# Patient Record
Sex: Male | Born: 1965 | Race: White | Hispanic: No | Marital: Single | Smoking: Never smoker
Health system: Southern US, Community
[De-identification: ages and names within clinical notes are randomized; demographics above are authoritative.]

## PROBLEM LIST (undated history)

## (undated) DIAGNOSIS — F429 Obsessive-compulsive disorder, unspecified: Secondary | ICD-10-CM

## (undated) DIAGNOSIS — F329 Major depressive disorder, single episode, unspecified: Secondary | ICD-10-CM

## (undated) DIAGNOSIS — F32A Depression, unspecified: Secondary | ICD-10-CM

## (undated) DIAGNOSIS — F419 Anxiety disorder, unspecified: Secondary | ICD-10-CM

## (undated) HISTORY — DX: Major depressive disorder, single episode, unspecified: F32.9

## (undated) HISTORY — DX: Obsessive-compulsive disorder, unspecified: F42.9

## (undated) HISTORY — PX: HERNIA REPAIR: SHX51

## (undated) HISTORY — DX: Anxiety disorder, unspecified: F41.9

## (undated) HISTORY — DX: Depression, unspecified: F32.A

---

## 2010-04-21 ENCOUNTER — Emergency Department (INDEPENDENT_AMBULATORY_CARE_PROVIDER_SITE_OTHER): Payer: Worker's Compensation

## 2010-04-21 ENCOUNTER — Emergency Department (HOSPITAL_BASED_OUTPATIENT_CLINIC_OR_DEPARTMENT_OTHER)
Admission: EM | Admit: 2010-04-21 | Discharge: 2010-04-21 | Disposition: A | Discharge status: Home / Self Care | Payer: Worker's Compensation | Attending: Emergency Medicine | Admitting: Emergency Medicine

## 2010-04-21 DIAGNOSIS — M25579 Pain in unspecified ankle and joints of unspecified foot: Secondary | ICD-10-CM

## 2010-04-21 DIAGNOSIS — W19XXXA Unspecified fall, initial encounter: Secondary | ICD-10-CM | POA: Insufficient documentation

## 2010-04-21 DIAGNOSIS — Y9269 Other specified industrial and construction area as the place of occurrence of the external cause: Secondary | ICD-10-CM

## 2010-04-21 DIAGNOSIS — IMO0002 Reserved for concepts with insufficient information to code with codable children: Secondary | ICD-10-CM | POA: Insufficient documentation

## 2010-04-21 DIAGNOSIS — Y9289 Other specified places as the place of occurrence of the external cause: Secondary | ICD-10-CM | POA: Insufficient documentation

## 2010-04-21 DIAGNOSIS — Y999 Unspecified external cause status: Secondary | ICD-10-CM

## 2010-04-21 DIAGNOSIS — S93409A Sprain of unspecified ligament of unspecified ankle, initial encounter: Secondary | ICD-10-CM | POA: Insufficient documentation

## 2011-07-31 IMAGING — CR DG ANKLE COMPLETE 3+V*L*
3 series · 3 of 3 positions shown · non-contrast
Comparison: None.

CLINICAL DATA: Fall, pain.

LEFT ANKLE COMPLETE - 3+ VIEW

[t ankle joint ap left]
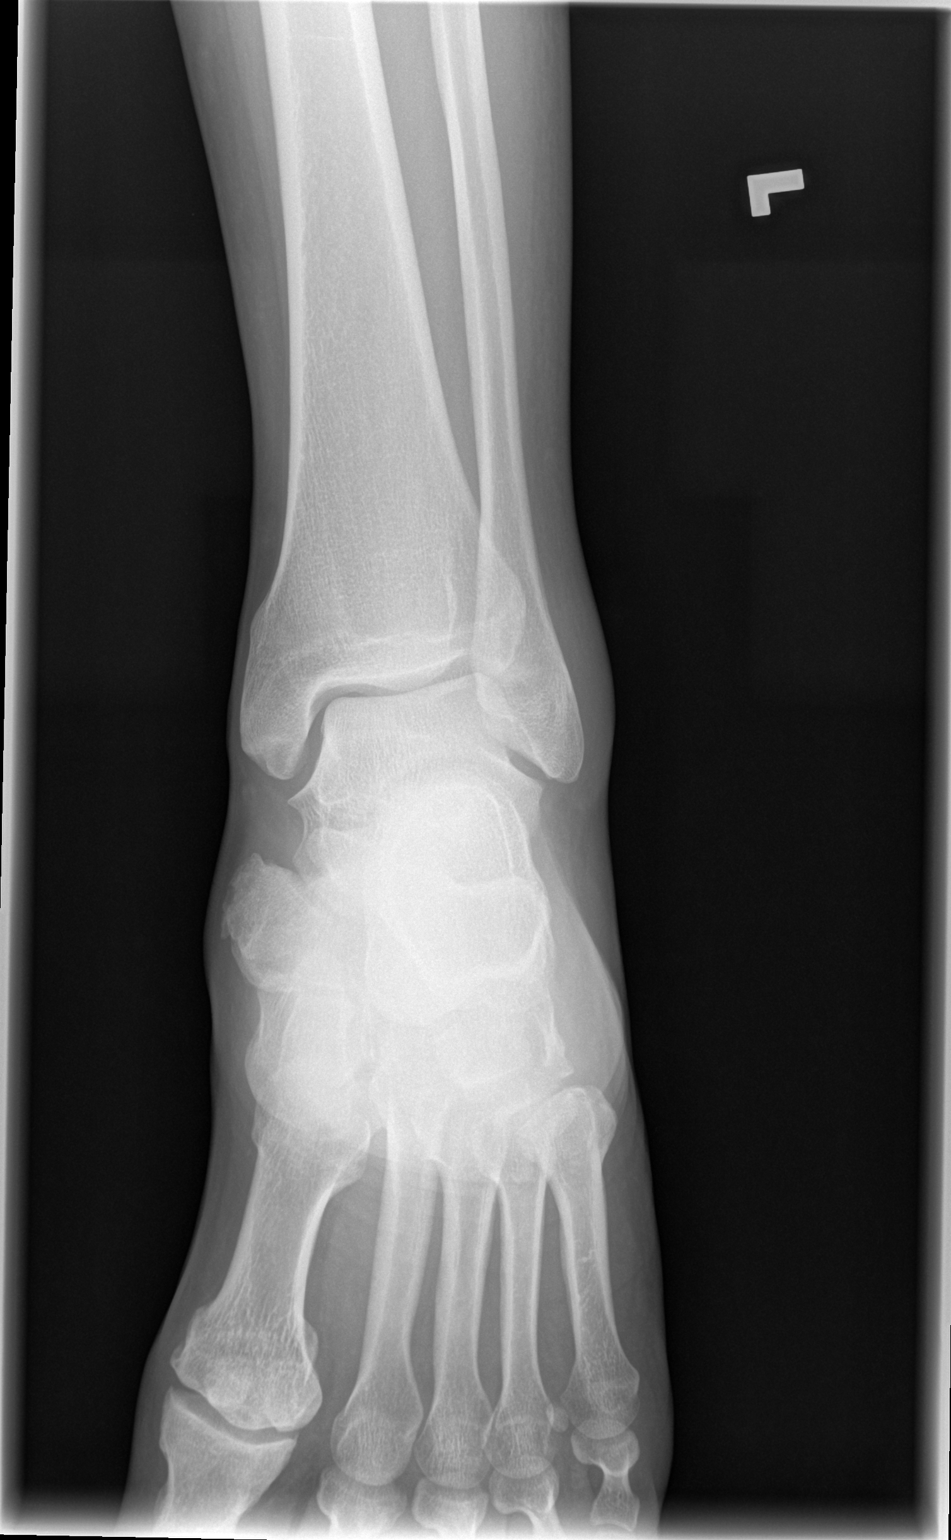

[t ankle joint oblique left]
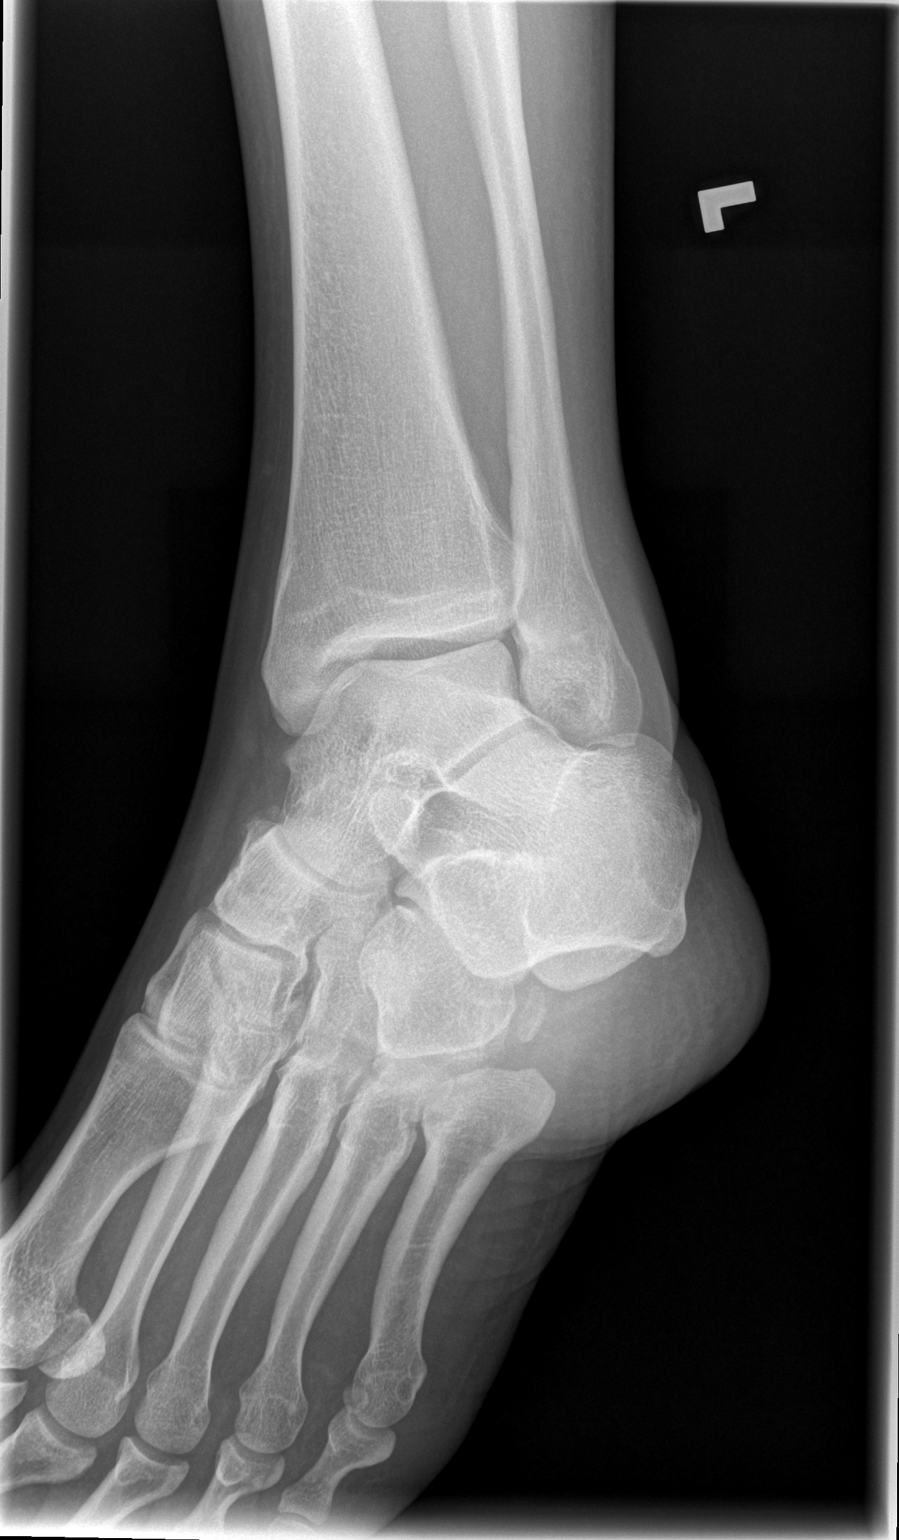

[t ankle joint lat left]
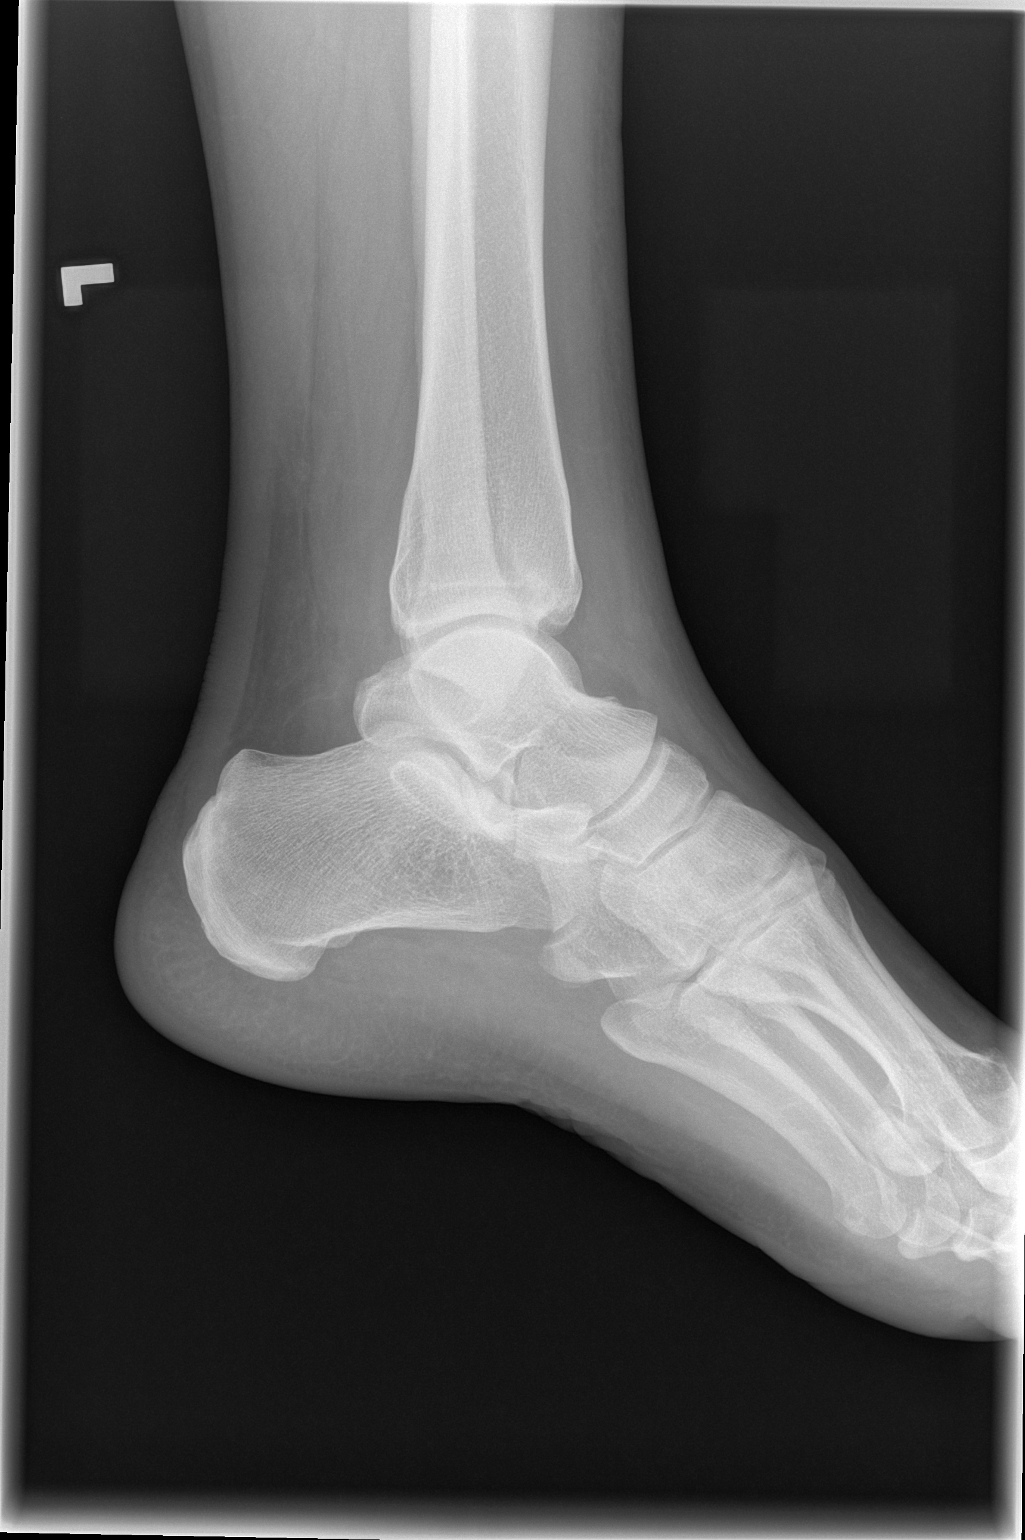

[3 of 3 positions shown; findings below may reference images not displayed]

FINDINGS: There is some soft tissue swelling about the distal
fibula.  No underlying fracture dislocation.
IMPRESSION: Lateral soft tissue swelling without underlying acute bony or joint
abnormality.

## 2011-08-02 ENCOUNTER — Ambulatory Visit (INDEPENDENT_AMBULATORY_CARE_PROVIDER_SITE_OTHER): Payer: No Typology Code available for payment source | Admitting: Family Medicine

## 2011-08-02 VITALS — BP 113/74 | HR 71 | Temp 98.1°F | Resp 16 | Ht 73.0 in | Wt 209.8 lb

## 2011-08-02 DIAGNOSIS — F429 Obsessive-compulsive disorder, unspecified: Secondary | ICD-10-CM

## 2011-08-02 MED ORDER — FLUOXETINE HCL 40 MG PO CAPS: 80.0000 mg | ORAL_CAPSULE | Freq: Every day | ORAL | Status: DC

## 2011-08-02 NOTE — Progress Notes (Signed)
  Subjective:    Patient ID: Jose Spears, male    DOB: [birth year], 46 y.o.   MRN: [id number]  HPI 46 yo male last seen here June 2011.  On prozac.  Needs refill.  Was refilled last at [facility] (closed now).  Doing well on 80 mg.  Takes for OCD.   No other problems or concerns today.    Review of Systems Negative except as per HPI     Objective:   Physical Exam  Constitutional: He appears well-developed and well-nourished.  Cardiovascular: Normal rate, regular rhythm, normal heart sounds and intact distal pulses.   No murmur heard. Pulmonary/Chest: Effort normal and breath sounds normal.  Neurological: He is alert.  Skin: Skin is warm and dry.          Assessment & Plan:  OCD - refilled prozac 40, 2 po daily #60 refill x 5

## 2012-03-22 ENCOUNTER — Ambulatory Visit (INDEPENDENT_AMBULATORY_CARE_PROVIDER_SITE_OTHER): Payer: No Typology Code available for payment source | Admitting: Emergency Medicine

## 2012-03-22 VITALS — BP 124/80 | HR 77 | Temp 98.9°F | Resp 18 | Wt 215.0 lb

## 2012-03-22 DIAGNOSIS — F429 Obsessive-compulsive disorder, unspecified: Secondary | ICD-10-CM

## 2012-03-22 MED ORDER — FLUOXETINE HCL 40 MG PO CAPS: 80.0000 mg | ORAL_CAPSULE | Freq: Every day | ORAL | Status: DC

## 2012-03-22 NOTE — Progress Notes (Signed)
[facility] [address], [place] [address] [phone]  Date:  03/22/2012   Name:  Jose Spears   DOB:  Apr 15, 1965   MRN:  [id number]  PCP:  No primary provider on file.    Chief Complaint: OCD   History of Present Illness:  Jose Spears is a 47 y.o. very pleasant male patient who presents with the following:  History of OCD on medication 8 years and is well controlled.  Sleeping well.  No side effects of drug.  Previous depression is controlled.  There is no problem list on file for this patient.   Past Medical History  Diagnosis Date  . Depression   . Anxiety   . OCD (obsessive compulsive disorder)     Past Surgical History  Procedure Date  . Hernia repair     History  Substance Use Topics  . Smoking status: Never Smoker   . Smokeless tobacco: Not on file  . Alcohol Use: No    No family history on file.  No Known Allergies  Medication list has been reviewed and updated.  Current Outpatient Prescriptions on File Prior to Visit  Medication Sig Dispense Refill  . FLUoxetine (PROZAC) 40 MG capsule Take 2 capsules (80 mg total) by mouth daily.  60 capsule  5    Review of Systems:  As per HPI, otherwise negative.    Physical Examination: Filed Vitals:   03/22/12 1020  BP: 124/80  Pulse: 77  Temp: 98.9 F (37.2 C)  Resp: 18   Filed Vitals:   03/22/12 1020  Weight: 215 lb (97.523 kg)   There is no height on file to calculate BMI. Ideal Body Weight:    GEN: WDWN, NAD, Non-toxic, A & O x 3 HEENT: Atraumatic, Normocephalic. Neck supple. No masses, No LAD. Ears and Nose: No external deformity. CV: RRR, No M/G/R. No JVD. No thrill. No extra heart sounds. PULM: CTA B, no wheezes, crackles, rhonchi. No retractions. No resp. distress. No accessory muscle use. ABD: S, NT, ND, +BS. No rebound. No HSM. EXTR: No c/c/e NEURO Normal gait.  PSYCH: Normally interactive. Conversant. Not depressed or anxious appearing.   Calm demeanor.    Assessment and Plan: OCD Refill medication Follow up 6 months   Carmelina Dane, MD

## 2012-03-24 NOTE — Progress Notes (Signed)
Reviewed and agree.

## 2012-11-13 ENCOUNTER — Other Ambulatory Visit: Payer: Self-pay | Admitting: Emergency Medicine

## 2012-11-15 ENCOUNTER — Other Ambulatory Visit: Payer: Self-pay

## 2012-11-20 ENCOUNTER — Encounter: Payer: Self-pay | Admitting: Physician Assistant

## 2012-11-20 ENCOUNTER — Ambulatory Visit (INDEPENDENT_AMBULATORY_CARE_PROVIDER_SITE_OTHER): Payer: No Typology Code available for payment source | Admitting: Physician Assistant

## 2012-11-20 VITALS — BP 124/76 | HR 77 | Temp 98.0°F | Resp 16 | Ht 73.0 in | Wt 226.0 lb

## 2012-11-20 DIAGNOSIS — F429 Obsessive-compulsive disorder, unspecified: Secondary | ICD-10-CM

## 2012-11-20 MED ORDER — FLUOXETINE HCL 40 MG PO CAPS: 80.0000 mg | ORAL_CAPSULE | Freq: Every day | ORAL | Status: DC

## 2012-11-20 NOTE — Progress Notes (Signed)
  Subjective:    Patient ID: Jose Spears, male    DOB: 05/31/1965, 47 y.o.   MRN: [id number]  HPI 47 year old male presents for refills of Prozac 80 mg daily that he takes for OCD. Doing well - has been on Prozac for 8 years now. Stable on current dose. No adverse effects.  Denies depression/anxiety, insomnia.   Works as a Electrical engineer x about 8 years. Has lived in [place] his whole life.  Single.  Patient is doing well with no other concerns today.      Review of Systems  Constitutional: Negative for unexpected weight change.  Respiratory: Negative for chest tightness.   Gastrointestinal: Negative for nausea and vomiting.  Neurological: Negative for dizziness and headaches.  Psychiatric/Behavioral: Negative for sleep disturbance.       Objective:   Physical Exam  Constitutional: He is oriented to person, place, and time. He appears well-developed and well-nourished.  HENT:  Head: Normocephalic and atraumatic.  Right Ear: External ear normal.  Left Ear: External ear normal.  Eyes: Conjunctivae are normal.  Neck: Normal range of motion.  Cardiovascular: Normal rate, regular rhythm and normal heart sounds.   Pulmonary/Chest: Effort normal and breath sounds normal.  Neurological: He is alert and oriented to person, place, and time.  Psychiatric: He has a normal mood and affect. His behavior is normal. Judgment and thought content normal.          Assessment & Plan:  OCD (obsessive compulsive disorder) - Plan: FLUoxetine (PROZAC) 40 MG capsule  Refilled Prozac 80 mg daily #180 x 1 refill.  Recommend he schedule an appointment for a CPE in the next 6 months. Patient in agreement.

## 2012-12-20 NOTE — Telephone Encounter (Signed)
Pt was seen 11/20/12.

## 2012-12-27 ENCOUNTER — Ambulatory Visit (INDEPENDENT_AMBULATORY_CARE_PROVIDER_SITE_OTHER): Payer: No Typology Code available for payment source | Admitting: Family Medicine

## 2012-12-27 VITALS — BP 112/72 | HR 88 | Temp 98.3°F | Resp 20 | Ht 72.25 in | Wt 222.8 lb

## 2012-12-27 DIAGNOSIS — F429 Obsessive-compulsive disorder, unspecified: Secondary | ICD-10-CM

## 2012-12-27 DIAGNOSIS — F411 Generalized anxiety disorder: Secondary | ICD-10-CM

## 2012-12-27 MED ORDER — FLUOXETINE HCL 40 MG PO CAPS: 80.0000 mg | ORAL_CAPSULE | Freq: Every day | ORAL | Status: DC

## 2012-12-29 NOTE — Progress Notes (Signed)
Pt was seen last mo for a refill on his prozac by [facility] who gave him a 90d supply with 3 refills. However, either the rx didn't go through or the pharmacy didn't fill his new rx and just refilled his prior rx of a month worth of pills from Dr. Dareen Piano.  The bottle said he needed an OV before any further refills so he came back in today - he wasn't sure why we needed to see him again but he just came back.  I think this was an error - pt should have a years worth of refills so I sent the rx to his pharmacy again and waived the OV.

## 2013-04-03 ENCOUNTER — Telehealth: Payer: Self-pay

## 2013-04-03 NOTE — Telephone Encounter (Signed)
Called pt, lady answered the phone and I advised we would call him back. Pt unavailable.

## 2013-04-03 NOTE — Telephone Encounter (Signed)
Placed form in Dr. Alver FisherShaw's box

## 2013-04-03 NOTE — Telephone Encounter (Signed)
Patient took a drug screen for his job. States that it came back positive however he is currently taking Prozac which he states he got from our office. Patient last saw Dr. Clelia CroftShaw 12/27/2012. Patient dropped off a form showing his drug screen results and states that his employer needs someone to sign off on it indicating that he is in fact on Prozac. Patient will come back to pick up form.  [phone]

## 2013-04-03 NOTE — Telephone Encounter (Signed)
Prozac does not cause a + drug screen.  Looks like the picture showed he was + for benzodiazepines which he is not prescribed here.  Prozac is not a benzodiazepine - it is a selective serotonin inhibitor.

## 2013-04-05 ENCOUNTER — Telehealth: Payer: Self-pay

## 2013-04-05 NOTE — Telephone Encounter (Signed)
Patient needs a note saying that he is taking prozac  [phone]

## 2013-04-05 NOTE — Telephone Encounter (Signed)
Patient left the wrong contact number in previous message. Correct one is: [phone]

## 2013-04-05 NOTE — Telephone Encounter (Signed)
Spoke to pt. Advised Xanax would cause a positive drug screen but not Prozac. Pt slurring words while on the phone. Difficult to understand he questions and statements during our conversation.

## 2013-04-08 NOTE — Telephone Encounter (Signed)
Letter has been printed. Both numbers listed are incorrect. Called home number listed in chart advised pt letter in in the p/u folder.

## 2013-04-13 ENCOUNTER — Ambulatory Visit (INDEPENDENT_AMBULATORY_CARE_PROVIDER_SITE_OTHER): Payer: No Typology Code available for payment source | Admitting: Family Medicine

## 2013-04-13 VITALS — BP 110/72 | HR 96 | Temp 98.0°F | Resp 18 | Ht 72.0 in | Wt 217.4 lb

## 2013-04-13 DIAGNOSIS — R6882 Decreased libido: Secondary | ICD-10-CM

## 2013-04-13 DIAGNOSIS — F411 Generalized anxiety disorder: Secondary | ICD-10-CM

## 2013-04-13 DIAGNOSIS — F429 Obsessive-compulsive disorder, unspecified: Secondary | ICD-10-CM

## 2013-04-13 DIAGNOSIS — N529 Male erectile dysfunction, unspecified: Secondary | ICD-10-CM

## 2013-04-13 MED ORDER — SILDENAFIL CITRATE 50 MG PO TABS: 25.0000 mg | ORAL_TABLET | Freq: Every day | ORAL | Status: AC | PRN

## 2013-04-13 NOTE — Progress Notes (Addendum)
This chart was scribed for Meredith StaggersJeffrey Jaleigha Deane, MD by Luisa DagoPriscilla Tutu, ED Scribe. This patient was seen in room 14 and the patient's care was started at 5:00 PM. Subjective:    Patient ID: Jose Spears, male    DOB: 1966/02/13, 48 y.o.   MRN: [id number]  Chief Complaint  Patient presents with  . Medication Problem    concerned with Prozac causing ED and decrease libido x 6 months    HPI HPI Comments: Jose Spears is a 48 y.o. male who presents to [facility].   Taking Prozac 80mg  for years for OCD and anxiety. Followed by Dr. Clelia CroftShaw most recently.  Past 6 months feels decreased libido, and trouble obtaining erections. Noted past 6 months only.  Has been on Prozac for 10 years, and noticed some decreased libido and trouble with erections initially years ago when first took this medicine, but resolved. Only noted now recently for past 6 months. No other new medicines. New relationship past few months only.   OCD and anxiety is controlled recently. No new concerns re: this.   Tried otc herbal med - yohimbe - worked ok. Has not taken Viagra or other med like this in past.  NO hx of heart disease, no BP meds.    There are no active problems to display for this patient.  Past Medical History  Diagnosis Date  . Depression   . Anxiety   . OCD (obsessive compulsive disorder)    Past Surgical History  Procedure Laterality Date  . Hernia repair     No Known Allergies Prior to Admission medications   Medication Sig Start Date End Date Taking? Authorizing Provider  FLUoxetine (PROZAC) 40 MG capsule Take 2 capsules (80 mg total) by mouth daily. 12/27/12   Sherren MochaEva N Shaw, MD   History   Social History  . Marital Status: Single    Spouse Name: N/A    Number of Children: N/A  . Years of Education: N/A   Occupational History  . Not on file.   Social History Main Topics  . Smoking status: Never Smoker   . Smokeless tobacco: Not on file  . Alcohol Use: No  .  Drug Use: No  . Sexual Activity: Yes   Other Topics Concern  . Not on file   Social History Narrative  . No narrative on file    Review of Systems  Constitutional: Negative for fatigue.  Respiratory: Negative for chest tightness.   Cardiovascular: Negative for chest pain.  Genitourinary: Negative for scrotal swelling and testicular pain.  Skin: Negative for rash.  Neurological: Negative for dizziness and light-headedness.  Psychiatric/Behavioral: The patient is not nervous/anxious.         Objective:   Physical Exam  Nursing note and vitals reviewed. Constitutional: He is oriented to person, place, and time. He appears well-developed and well-nourished. No distress.  HENT:  Head: Normocephalic and atraumatic.  Eyes: EOM are normal.  Neck: Neck supple. No tracheal deviation present.  Cardiovascular: Normal rate.   Pulmonary/Chest: Effort normal. No respiratory distress.  Genitourinary: Right testis shows no mass and no tenderness. Left testis shows no mass and no tenderness.  Musculoskeletal: Normal range of motion.  Neurological: He is alert and oriented to person, place, and time.  Skin: Skin is warm and dry.  Psychiatric: He has a normal mood and affect. His behavior is normal.    Filed Vitals:   04/13/13 1622  BP: 110/72  Pulse: 96  Temp:  98 F (36.7 C)  TempSrc: Oral  Resp: 18  Height: 6' (1.829 m)  Weight: 217 lb 6.4 oz (98.612 kg)  SpO2: 96%       Assessment & Plan:   Jose Spears is a 48 y.o. male Erectile dysfunction - Plan: sildenafil (VIAGRA) 50 MG tablet, Testosterone, Decreased libido - Plan: Testosterone. Long term higher dose of SSRi can affect libido, but noted more change in libido and ED past 6 months - will check testosterone, trial of low dose Viagra - SED, CP precautions if has taken. If low testosterone reading on morning labs (lab only order placed), would verify with repeat testing. New relationship may also be affecting ED, which  was also discussed. As well controlled OCD, would hold on changes to Prozac dosing at present.   OCD (obsessive compulsive disorder), Anxiety state, unspecified - stable on current prozac dosing. No changes at present.    Meds ordered this encounter  Medications  . sildenafil (VIAGRA) 50 MG tablet    Sig: Take 0.5-1 tablets (25-50 mg total) by mouth daily as needed for erectile dysfunction.    Dispense:  6 tablet    Refill:  2   Patient Instructions  Return for labs only - morning in next week.  You should receive a call or letter about your lab results within the next week to 10 days after labs drawn.  If level is low, will need repeated to verify.  Try 1/2 -1 of viagra as discussed.  Return to the clinic or go to the nearest emergency room if any of your symptoms worsen or new symptoms occur.

## 2013-04-13 NOTE — Patient Instructions (Signed)
Return for labs only - morning in next week.  You should receive a call or letter about your lab results within the next week to 10 days after labs drawn.  If level is low, will need repeated to verify.  Try 1/2 -1 of viagra as discussed.  Return to the clinic or go to the nearest emergency room if any of your symptoms worsen or new symptoms occur.

## 2013-08-07 ENCOUNTER — Ambulatory Visit (INDEPENDENT_AMBULATORY_CARE_PROVIDER_SITE_OTHER): Payer: No Typology Code available for payment source | Admitting: Physician Assistant

## 2013-08-07 VITALS — BP 108/60 | HR 79 | Temp 98.0°F | Ht 72.0 in | Wt 218.0 lb

## 2013-08-07 DIAGNOSIS — F429 Obsessive-compulsive disorder, unspecified: Secondary | ICD-10-CM

## 2013-08-07 MED ORDER — FLUOXETINE HCL 40 MG PO CAPS: 80.0000 mg | ORAL_CAPSULE | Freq: Every day | ORAL | Status: DC

## 2013-08-07 NOTE — Patient Instructions (Signed)
Continue taking Prozac 80mg  daily  Plan to schedule a complete physical in about 6 months or so to check routine labs, make sure you are up to date on health screenings, etc.  You can always walk in for this, or you can make an appointment at our clinic next door by calling our main number

## 2013-08-07 NOTE — Progress Notes (Signed)
   Subjective:    Patient ID: Jose Spears, male    DOB: Oct 11, 1965, 48 y.o.   MRN: [id number]  HPI   Jose Spears is a very pleasant 48 yr old male here for refill of Prozac.  Has taken 80mg  daily for many years for treatment of OCD.  He feels that the medication controls his symptoms well.  He denies adverse effects.    He identifies [facility] as PCP.  Thinks last CPE about 1 yr ago, though on chart review looks like he has only been seen for med refills   Review of Systems  Constitutional: Negative.   Respiratory: Negative.   Cardiovascular: Negative.   Psychiatric/Behavioral: Negative for suicidal ideas, sleep disturbance, self-injury and dysphoric mood. The patient is not nervous/anxious.        Objective:   Physical Exam  Vitals reviewed. Constitutional: He is oriented to person, place, and time. He appears well-developed and well-nourished. No distress.  HENT:  Head: Normocephalic and atraumatic.  Eyes: Conjunctivae are normal. No scleral icterus.  Cardiovascular: Normal rate, regular rhythm and normal heart sounds.   Pulmonary/Chest: Effort normal and breath sounds normal. He has no wheezes. He has no rales.  Neurological: He is alert and oriented to person, place, and time.  Skin: Skin is warm and dry.  Psychiatric: He has a normal mood and affect. His behavior is normal.       Assessment & Plan:  OCD (obsessive compulsive disorder) - Plan: FLUoxetine (PROZAC) 40 MG capsule   Jose Spears is a very pleasant 48 yr old male here for refill of Prozac 80mg  for treatment of OCD.  Stable on current dose.  Continue 80mg  daily.  Encouraged pt to schedule CPE in the next 6 months   E. Frances Furbish MHS, PA-C [facility] 6/3/201510:48 PM

## 2014-03-18 ENCOUNTER — Ambulatory Visit: Payer: Self-pay

## 2016-07-12 ENCOUNTER — Encounter: Payer: Self-pay | Admitting: Physician Assistant

## 2016-07-12 ENCOUNTER — Ambulatory Visit (INDEPENDENT_AMBULATORY_CARE_PROVIDER_SITE_OTHER): Payer: Managed Care, Other (non HMO) | Admitting: Physician Assistant

## 2016-07-12 VITALS — BP 125/77 | HR 95 | Temp 98.4°F | Resp 17 | Ht 73.5 in | Wt 221.0 lb

## 2016-07-12 DIAGNOSIS — F429 Obsessive-compulsive disorder, unspecified: Secondary | ICD-10-CM | POA: Diagnosis not present

## 2016-07-12 MED ORDER — SERTRALINE HCL 50 MG PO TABS: ORAL_TABLET | ORAL | 0 refills | Status: AC

## 2016-07-12 NOTE — Progress Notes (Signed)
Jose FellersChristopher M Juba  MRN: [id number] DOB: 1965-03-21  Subjective:  Jose FellersChristopher M Bruster is a 51 y.o. male seen in office today for a chief complaint of refill of OCD medication. Has been out of prozac since at least 03/2016. Prior to this he had had been taking prozac 40mg  since 2006 for control of OCD. Notes it really helped but he did not like the side effects, most notably erectile dysfunction (ED). Therefore, he quit taking medication. Notes the ED significantly improved once stopping prozac.   Of note, pt has had a dx of OCD since age 51. Diagnosed by a psychologist. Notes he dewlls too long on subjects that he wishes he could forget. He will do small ritiuals to make situations more comfortable. The rituals and thoughts have significantly progressed since he stopped prozac. He is willing to try any other SSRI, in hopes that it will not lead to ED. He endorses mild anxiety associated with his OCD. Denies depression and suicidal thoughts.   Pt is not currently seeing anyone for therapy and does not wish to do so at this time.   Review of Systems  Constitutional: Negative for chills, diaphoresis and fever.  Cardiovascular: Negative for chest pain and palpitations.  Gastrointestinal: Negative for abdominal pain, nausea and vomiting.    There are no active problems to display for this patient.   Current Outpatient Prescriptions on File Prior to Visit  Medication Sig Dispense Refill  . sildenafil (VIAGRA) 50 MG tablet Take 0.5-1 tablets (25-50 mg total) by mouth daily as needed for erectile dysfunction. (Patient not taking: Reported on 07/12/2016) 6 tablet 2   No current facility-administered medications on file prior to visit.     No Known Allergies   Objective:  BP 125/77   Pulse 95   Temp 98.4 F (36.9 C) (Oral)   Resp 17   Ht 6' 1.5" (1.867 m)   Wt 221 lb (100.2 kg)   SpO2 98%   BMI 28.76 kg/m   Physical Exam  Constitutional: He is oriented to person, place, and time  and well-developed, well-nourished, and in no distress.  HENT:  Head: Normocephalic and atraumatic.  Eyes: Conjunctivae are normal.  Neck: Normal range of motion.  Pulmonary/Chest: Effort normal.  Neurological: He is alert and oriented to person, place, and time. Gait normal.  Skin: Skin is warm and dry.  Psychiatric: His mood appears not anxious. He is not agitated. He does not exhibit a depressed mood. He expresses no suicidal ideation.  Vitals reviewed.   Assessment and Plan :  1. Obsessive-compulsive disorder, unspecified type Had in depth conversation that all SSRIs FDA approved for the tx of OCD have a side effect profile of sexual dysfunction. However, we can attempt to try a different agent and see how pt tolerates it. Will start with low dose zoloft. Slow taper to 100mg  over the next 4 weeks. Pt to follow up in 4 weeks for reevaluation, may have to increase to a dose of 200mg  for proper management of OCD. If ED persists despite new SSRI, can consider adding Wellbutrin to regimen. Pt given educational material on zoloft and instructed to seek care immediately if he starts to experience depressed mood or suicidal ideation. Pt voices his understanding. He also declines therapy at this time.  - sertraline (ZOLOFT) 50 MG tablet; Weeks 1-2: Take 1 tablet daily. Weeks 3-4: Take 2 tablets daily.  Dispense: 90 tablet; Refill: 0   A total of 25 was spent in the room  with the patient, greater than 50% of which was in counseling/coordination of care regarding treatment plan of OCD.  Benjiman Core PA-C  [facility] 07/12/2016 10:25 PM

## 2016-07-12 NOTE — Patient Instructions (Addendum)
We are going to try zoloft for treatment of OCD. Start with taking one 50mg  tablet daily for two weeks, then increase to two tablets daily for two weeks. Follow up with me at four weeks and we will establish how you are doing. We are likely going to have to increase this medication at this visit but we will see how you are tolerating the side effects. Common side effects are nausea and GI upset so try taking it with food to see if this helps decrease the chance of getting these side effects. Typically side effects only last 1-2 weeks after starting the medication. SSRIs can also increase the risk of depression and suicidal thoughts, if you start to have any of these symptoms please seek care immediately. Thank you for letting me participate in your health and well being.   Obsessive-Compulsive Disorder Obsessive-compulsive disorder (OCD) is a brain-based anxiety disorder. People with OCD have obsessions, compulsions, or both. Obsessions are unwanted and distressing thoughts, ideas, or urges that keep entering your mind and result in anxiety. You may find yourself trying to ignore them. You may try to stop or undo them with a compulsion. Compulsions are repetitive physical or mental acts that you feel you have to do. They may reduce or prevent any emotional distress, but in most instances, they are ineffective. Compulsions can be very time-consuming, often taking more than one hour each day. They can interfere with personal relationships and normal activities at home, school, or work. OCD can begin in childhood, but it usually starts in young adulthood and continues throughout life. Many people with OCD also have depression or another mental health disorder. What are the causes? The cause of this condition is not known. What increases the risk? This condition is more like to develop in:  People who have experienced trauma.  People who have a family history of OCD.  Women during and after  pregnancy.  People who have infections and post-infectious autoimmune syndrome.  People who have other mental health conditions.  People who abuse substances. What are the signs or symptoms? Symptoms of OCD include compulsions and obsessions. People with obsessions usually have a fear that something terrible will happen or that they will do something terrible. Examples of common obsessions include:  Fear of contamination with germs, waste, or poisonous substances.  Fear of making the wrong decision.  Violent or sexual thoughts or urges towards others.  Need for symmetry or exactness. Examples of common compulsions include:  Excessive handwashing or bathing due to fear of contamination.  Checking things over and over to make sure you finished a task, such as making sure you locked a door or unplugged a toaster.  Repeating an act or phrase over and over, sometimes a specific number of times, until it feels right.  Arranging and rearranging objects to keep them in a certain order.  Having a very hard time making a decision and sticking to it. How is this diagnosed? OCD is diagnosed through an assessment by your health care provider. Your health care provider will ask questions about any obsessions or compulsions you have and how they affect your life. Your health care provider may also ask about your medical history, prescription medicines, and drug use. Certain medical conditions and substances can cause symptoms that are similar to OCD. Your health care provider may also refer you to a mental health specialist. How is this treated? Treatment may include:  Cognitive therapy. This is a form of talk therapy. The goal  is to identify and change the irrational thoughts associated with obsessions.  Behavioral therapy. A type of behavioral therapy called exposure and response prevention is often used. In this therapy, you will be exposed to the distressing situation that triggers your  compulsion and be prevented from responding to it. With repetition of this process over time, you will no longer feel the distress or need to perform the compulsion.  Self-soothing. Meditation, deep breathing, or yoga can help you manage the physiological symptoms of anxiety and can help with how you think.  Medicine. Certain types of antidepressant medicine may help reduce or control OCD symptoms. Medicine is most effective when used with cognitive or behavioral therapy. Treatment usually involves a combination of therapy and medicines. For severe OCD that does not respond to talk therapy and medicine, brain surgery or electrical stimulation of specific areas of the brain (deep brain stimulation) may be considered. Follow these instructions at home:  Take over-the-counter and prescription medicines only as told by your health care provider. Do not start taking any new medicines with approval from your health care provider.  Consider joining a support group for people with OCD.  Keep all follow-up visits as told by your health care provider. This is important. Contact a health care provider if:  You are not able to take your medicines as prescribed.  Your symptoms get worse. Get help right away if:  You have suicidal thoughts or thoughts about hurting yourself or others. If you ever feel like you may hurt yourself or others, or have thoughts about taking your own life, get help right away. You can go to your nearest emergency department or call:  Your local emergency services (911 in the [place]  A suicide crisis helpline, such as the National Suicide Prevention Lifeline at 228-335-3169. This is open 24-hours a day. Summary  Obsessive-compulsive disorder (OCD) is a brain-based anxiety disorder. People with OCD have obsessions, compulsions, or both.  OCD can interfere with personal relationships and normal activities at home, school, or work.  Treatment usually involves a combination  of therapy and medicines. This information is not intended to replace advice given to you by your health care provider. Make sure you discuss any questions you have with your health care provider. Document Released: 02/15/2001 Document Revised: 12/07/2015 Document Reviewed: 12/07/2015 Elsevier Interactive Patient Education  2017 [employer]  Sertraline oral solution What is this medicine? SERTRALINE (SER tra leen) is used to treat depression. It may also be used to treat obsessive compulsive disorder, panic disorder, post-trauma stress, premenstrual dysphoric disorder (PMDD) or social anxiety. This medicine may be used for other purposes; ask your health care provider or pharmacist if you have questions. COMMON BRAND NAME(S): Zoloft What should I tell my health care provider before I take this medicine? They need to know if you have any of these conditions: -bleeding disorders -bipolar disorder or a family history of bipolar disorder -glaucoma -heart disease -high blood pressure -history of irregular heartbeat -history of low levels of calcium, magnesium, or potassium in the blood -if you often drink alcohol -liver disease -receiving electroconvulsive therapy -seizures -suicidal thoughts, plans, or attempt; a previous suicide attempt by you or a family member -take medicines that treat or prevent blood clots -thyroid disease -an unusual or allergic reaction to sertraline, other medicines, foods, dyes, or preservatives -pregnant or trying to get pregnant -breast-feeding How should I use this medicine? Take this medicine by mouth. Follow the directions on the prescription label. Before taking your  dose, you need to dilute the solution in a beverage. Measure your medicine dose using the dropper in the bottle. Next, place the measured dose in at least 4 ounces (one-half cup) of water, ginger-ale, lemon-lime soda, lemonade or orange juice and mix. Do not mix in grapefruit juice or in any  other liquids other than those listed. Drink all of mixed liquid immediately. Do not mix the dose and store it for later. It must be taken right away. You may take this medicine with or without food. Take your medicine at regular intervals. Do not take your medicine more often than directed. Do not stop taking this medicine suddenly except upon the advice of your doctor. Stopping this medicine too quickly may cause serious side effects or your condition may worsen. A special MedGuide will be given to you by the pharmacist with each prescription and refill. Be sure to read this information carefully each time. Talk to your pediatrician regarding the use of this medicine in children. While this drug may be prescribed for children as young as 7 years for selected conditions, precautions do apply. Overdosage: If you think you have taken too much of this medicine contact a poison control center or emergency room at once. NOTE: This medicine is only for you. Do not share this medicine with others. What if I miss a dose? If you miss a dose, take it as soon as you can. If it is almost time for your next dose, take only that dose. Do not take double or extra doses. What may interact with this medicine? Do not take this medicine with any of the following medications: -cisapride -dofetilide -dronedarone -linezolid -MAOIs like Carbex, Eldepryl, Marplan, Nardil, and Parnate -methylene blue (injected into a vein) -pimozide -thioridazine This medicine may also interact with the following medications: -alcohol -amphetamines -aspirin and aspirin-like medicines -certain medicines for depression, anxiety, or psychotic disturbances -certain medicines for fungal infections like ketoconazole, fluconazole, posaconazole, and itraconazole -certain medicines for irregular heart beat like flecainide, quinidine, propafenone -certain medicines for migraine headaches like almotriptan, eletriptan, frovatriptan,  naratriptan, rizatriptan, sumatriptan, zolmitriptan -certain medicines for sleep -certain medicines for seizures like carbamazepine, valproic acid, phenytoin -certain medicines that treat or prevent blood clots like warfarin, enoxaparin, dalteparin -cimetidine -digoxin -diuretics -fentanyl -isoniazid -lithium -NSAIDs, medicines for pain and inflammation, like ibuprofen or naproxen -other medicines that prolong the QT interval (cause an abnormal heart rhythm) -rasagiline -safinamide -supplements like St. John's wort, kava kava, valerian -tolbutamide -tramadol -tryptophan This list may not describe all possible interactions. Give your health care provider a list of all the medicines, herbs, non-prescription drugs, or dietary supplements you use. Also tell them if you smoke, drink alcohol, or use illegal drugs. Some items may interact with your medicine. What should I watch for while using this medicine? Tell your doctor if your symptoms do not get better or if they get worse. Visit your doctor or health care professional for regular checks on your progress. Because it may take several weeks to see the full effects of this medicine, it is important to continue your treatment as prescribed by your doctor. Patients and their families should watch out for new or worsening thoughts of suicide or depression. Also watch out for sudden changes in feelings such as feeling anxious, agitated, panicky, irritable, hostile, aggressive, impulsive, severely restless, overly excited and hyperactive, or not being able to sleep. If this happens, especially at the beginning of treatment or after a change in dose, call your health care professional.  You may get drowsy or dizzy. Do not drive, use machinery, or do anything that needs mental alertness until you know how this medicine affects you. Do not stand or sit up quickly, especially if you are an older patient. This reduces the risk of dizzy or fainting spells.  Alcohol may interfere with the effect of this medicine. Avoid alcoholic drinks. This medicine contains a high percentage of alcohol that may interact with medicines used to treat alcohol abuse, like Antabuse (disulfiram). You should not take these medicines together. Your mouth may get dry. Chewing sugarless gum or sucking hard candy, and drinking plenty of water may help. Contact your doctor if the problem does not go away or is severe. Some products may contain alcohol. Ask your pharmacist or healthcare provider if this medicine contains alcohol. Be sure to tell all healthcare providers you are taking this medicine. Certain medicines, like metronidazole and disulfiram, can cause an unpleasant reaction when taken with alcohol. The reaction includes flushing, headache, nausea, vomiting, sweating, and increased thirst. The reaction can last from 30 minutes to several hours. What side effects may I notice from receiving this medicine? Side effects that you should report to your doctor or health care professional as soon as possible: -allergic reactions like skin rash, itching or hives, swelling of the face, lips, or tongue -anxious -black, tarry stools -changes in vision -confusion -elevated mood, decreased need for sleep, racing thoughts, impulsive behavior -eye pain -fast, irregular heartbeat -feeling faint or lightheaded, falls -feeling agitated, angry, or irritable -hallucination, loss of contact with reality -loss of balance or coordination -loss of memory -painful or prolonged erections -restlessness, pacing, inability to keep still -seizures -stiff muscles -suicidal thoughts or other mood changes -trouble sleeping -unusual bleeding or bruising -unusually weak or tired -vomiting Side effects that usually do not require medical attention (report to your doctor or health care professional if they continue or are bothersome): -change in appetite or weight -change in sex drive or  performance -diarrhea -increased sweating -indigestion, nausea -tremors This list may not describe all possible side effects. Call your doctor for medical advice about side effects. You may report side effects to FDA at 1-800-FDA-1088. Where should I keep my medicine? Keep out of the reach of children. Store at room temperature between 15 and 30 degrees C (59 and 86 degrees F). Throw away any unused medicine after the expiration date. NOTE: This sheet is a summary. It may not cover all possible information. If you have questions about this medicine, talk to your doctor, pharmacist, or health care provider.  2018 Elsevier/Gold Standard (2016-02-26 13:50:42)   IF you received an x-ray today, you will receive an invoice from [facility]. Please contact [facility] at [phone] with questions or concerns regarding your invoice.   IF you received labwork today, you will receive an invoice from [facility]. Please contact [facility] at [phone] with questions or concerns regarding your invoice.   Our billing staff will not be able to assist you with questions regarding bills from these companies.  You will be contacted with the lab results as soon as they are available. The fastest way to get your results is to activate your My Chart account. Instructions are located on the last page of this paperwork. If you have not heard from [place] regarding the results in 2 weeks, please contact this office.     Dis

## 2016-07-21 ENCOUNTER — Telehealth: Payer: Self-pay

## 2016-07-21 NOTE — Telephone Encounter (Signed)
Jose Spears, Gojt a denial letter from patient's [employer] today.  They are requesting that maximum dose does not exceed 1500 per day.  They advise reducing quantity to allowed quantity and days supply. If you are not comfortable with this, let me know and I will call them to see if I can get approval of 2 per day. Thanks, [employer]

## 2016-07-22 NOTE — Telephone Encounter (Signed)
Dorothy SparkHey Renay,   What medication are they referring to? I have only prescribed him zoloft 100mg  daily. Please let me know if there is something else I need to do. Thanks!

## 2016-07-27 NOTE — Telephone Encounter (Signed)
Please disregard previous message.  I think I entered it in error.
